# Patient Record
Sex: Male | Born: 1980
Health system: Southern US, Community
[De-identification: ages and names within clinical notes are randomized; demographics above are authoritative.]

## PROBLEM LIST (undated history)

## (undated) DIAGNOSIS — L659 Nonscarring hair loss, unspecified: Secondary | ICD-10-CM

## (undated) DIAGNOSIS — J302 Other seasonal allergic rhinitis: Secondary | ICD-10-CM

## (undated) HISTORY — DX: Nonscarring hair loss, unspecified: L65.9

## (undated) HISTORY — PX: PILONIDAL CYST EXCISION: SHX744

## (undated) HISTORY — DX: Other seasonal allergic rhinitis: J30.2

---

## 2016-08-03 ENCOUNTER — Ambulatory Visit (HOSPITAL_COMMUNITY)
Admission: RE | Admit: 2016-08-03 | Discharge: 2016-08-03 | Disposition: A | Discharge status: Home / Self Care | Payer: Self-pay | Source: Ambulatory Visit | Attending: Occupational Medicine | Admitting: Occupational Medicine

## 2016-08-03 ENCOUNTER — Other Ambulatory Visit: Payer: Self-pay | Admitting: Occupational Medicine

## 2016-08-03 DIAGNOSIS — R7611 Nonspecific reaction to tuberculin skin test without active tuberculosis: Secondary | ICD-10-CM

## 2017-03-30 LAB — CBC AND DIFFERENTIAL
HEMATOCRIT: 46 (ref 41–53)
Hemoglobin: 15.3 (ref 13.5–17.5)
Platelets: 219 (ref 150–399)
WBC: 8.6

## 2017-03-30 LAB — HEPATIC FUNCTION PANEL
ALT: 27 (ref 10–40)
AST: 32 (ref 14–40)
Alkaline Phosphatase: 48 (ref 25–125)
BILIRUBIN, TOTAL: 0.9

## 2017-03-30 LAB — BASIC METABOLIC PANEL
BUN: 12 (ref 4–21)
Creatinine: 0.7 (ref 0.6–1.3)
Glucose: 95
Potassium: 4.2 (ref 3.4–5.3)
SODIUM: 138 (ref 137–147)

## 2017-03-30 LAB — PSA: PSA: 0.29

## 2017-03-30 LAB — TSH: TSH: 1.08 (ref 0.41–5.90)

## 2017-03-30 LAB — LIPID PANEL
Cholesterol: 165 (ref 0–200)
HDL: 46 (ref 35–70)
LDL CALC: 105
TRIGLYCERIDES: 72 (ref 40–160)

## 2017-05-22 ENCOUNTER — Encounter: Payer: Self-pay | Admitting: Family Medicine

## 2017-05-22 ENCOUNTER — Ambulatory Visit: Payer: 59 | Admitting: Family Medicine

## 2017-05-22 VITALS — BP 110/82 | HR 56 | Temp 98.5°F | Ht 66.75 in | Wt 147.6 lb

## 2017-05-22 DIAGNOSIS — L659 Nonscarring hair loss, unspecified: Secondary | ICD-10-CM | POA: Insufficient documentation

## 2017-05-22 DIAGNOSIS — Z0000 Encounter for general adult medical examination without abnormal findings: Secondary | ICD-10-CM

## 2017-05-22 DIAGNOSIS — J302 Other seasonal allergic rhinitis: Secondary | ICD-10-CM | POA: Insufficient documentation

## 2017-05-22 DIAGNOSIS — R21 Rash and other nonspecific skin eruption: Secondary | ICD-10-CM | POA: Insufficient documentation

## 2017-05-22 MED ORDER — TRIAMCINOLONE ACETONIDE 0.1 % EX CREA
1.0000 | TOPICAL_CREAM | Freq: Two times a day (BID) | CUTANEOUS | 0 refills | Status: DC
Start: 2017-05-22 — End: 2019-09-20

## 2017-05-22 NOTE — Assessment & Plan Note (Signed)
S:Dry skin in winter. Silvery rash on dorsal aspect right wrist last winter.  At least 1 month rash ulnar side of rash- erythematous papules and itches at times. tried otc 1% hydrocortisone  Seems to be better in the sun.  A/P: we discussed possibility of psoriasis. Use triamcinolone 7-10 days. Discussed testing sun exposure more. May be worth seeing him in winter to see what it looks like. Discussed mainly topical treatments. No joint pain to suggest psoriatic arthritis.

## 2017-05-22 NOTE — Patient Instructions (Addendum)
Tdap next year. Or you can always stop by for a nurse visit- would need to schedule that. If you were to get a significant cut/scrape please let us know and we can do sooner.   Try triamcinolone for 7-10 days twice a day. Can cover with eucerin if desired. Can reuse in future but take 7-10 day break. With silvery appearance you have noticed and improvement with son- possible psoriasis.   No changes today

## 2017-05-22 NOTE — Progress Notes (Signed)
Phone: [phone]  Subjective:  Patient presents today to establish care.  Denies recent PCP. Chief complaint-noted.   See problem oriented charting  The following were reviewed and entered/updated in epic: Past Medical History:  Diagnosis Date  . Hair loss    finasteride 3-4x a week. still losing some hair  . Seasonal allergies    allegra   Patient Active Problem List   Diagnosis Date Noted  . Rash 05/22/2017    Priority: Low  . Hair loss 05/22/2017    Priority: Low  . Seasonal allergies     Priority: Low   Past Surgical History:  Procedure Laterality Date  . PILONIDAL CYST EXCISION      Family History  Problem Relation Age of Onset  . Hypertension Mother   . Heart attack Father        age 16, smoker  . Diabetes Father        just metformin  . Diabetes Sister        obese- metformin. right at 6.5  . Stroke Maternal Grandmother   . Atrial fibrillation Maternal Grandmother   . Dementia Maternal Grandfather   . Dementia Paternal Grandmother   . Dementia Paternal Grandfather   . Colon cancer Cousin        fathers cousin at 54. first cousin    Medications- reviewed and updated Current Outpatient Medications  Medication Sig Dispense Refill  . b complex vitamins capsule Take 1 capsule by mouth daily.    . fexofenadine-pseudoephedrine (ALLEGRA-D) 60-120 MG 12 hr tablet Take 1 tablet by mouth 2 (two) times daily.    . finasteride (PROPECIA) 1 MG tablet Take 1 mg by mouth daily.    . vitamin C (ASCORBIC ACID) 500 MG tablet Take 500 mg by mouth daily.    Marland Kitchen triamcinolone cream (KENALOG) 0.1 % Apply 1 application topically 2 (two) times daily. For 7-10 days maximum 80 g 0   No current facility-administered medications for this visit.     Allergies-reviewed and updated No Known Allergies  Social History   Social History Narrative  . Not on file    ROS--Full ROS was completed Review of Systems  Constitutional: Negative for chills and fever.  HENT: Negative  for hearing loss and tinnitus.   Eyes: Negative for blurred vision and double vision.  Respiratory: Negative for cough and hemoptysis.   Cardiovascular: Negative for chest pain and palpitations.  Gastrointestinal: Negative for heartburn and nausea.  Genitourinary: Negative for dysuria and urgency.  Musculoskeletal: Negative for myalgias and neck pain (none in over a year).  Skin: Positive for itching and rash.  Neurological: Negative for dizziness and headaches.  Endo/Heme/Allergies: Negative for polydipsia. Does not bruise/bleed easily.  Psychiatric/Behavioral: Negative for hallucinations and substance abuse.   Objective: BP 110/82 (BP Location: Left Arm, Patient Position: Sitting, Cuff Size: Normal)   Pulse (!) 56   Temp 98.5 F (36.9 C) (Oral)   Ht 5' 6.75" (1.695 m)   Wt 147 lb 9.6 oz (67 kg)   SpO2 97%   BMI 23.29 kg/m  Gen: NAD, resting comfortably HEENT: Mucous membranes are moist. Oropharynx normal. TM normal. Eyes: sclera and lids normal, PERRLA Neck: no thyromegaly, no cervical lymphadenopathy CV: slightly bradycardic but regular no murmurs rubs or gallops Lungs: CTAB no crackles, wheeze, rhonchi Abdomen: soft/nontender/nondistended/normal bowel sounds. No rebound or guarding.  Ext: no edema Skin: warm, dry Neuro: 5/5 strength in upper and lower extremities, normal gait, normal reflexes Assessment/Plan:  37 y.o. male presenting for  annual physical.  Health Maintenance counseling: 1. Anticipatory guidance: Patient counseled regarding regular dental exams -q6 months in [place] as well as locally, eye exams lasik done early 20s- occasional dry eyes, wearing seatbelts.  2. Risk factor reduction:  Advised patient of need for regular exercise and diet rich and fruits and vegetables to reduce risk of heart attack and stroke. Exercise- at least 3 days a week- used to be more into running. Diet-eats pretty clean.  Wt Readings from Last 3 Encounters:  05/22/17 147 lb 9.6 oz (67  kg)  3. Immunizations/screenings/ancillary studies- will do Tdap sometime in next year. Screened for HIV after needlestick. Will come in this year if he gets cut/scrape- otherwise likely do at CPE next year Immunization History  Administered Date(s) Administered  . Tdap 06/03/2008   4. Prostate cancer screening- screened through [facility] . No rectal planned Lab Results  Component Value Date   PSA 0.29 03/30/2017   5. Colon cancer screening - no family history, start at age 36 6. Skin cancer screening/prevention- advised regular sunscreen use. Denies worrisome, changing, or new skin lesions.  7. Testicular cancer screening- advised monthly self exams  8. 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 Since leaving fellowship- No neck pain in last year.   LDL 105 on labs. workign out 3x a week. LDL was better when he was running. LDL down to 78 with running- he is considering restarting  Rash S:Dry skin in winter. Silvery rash on dorsal aspect right wrist last winter.  At least 1 month rash ulnar side of rash- erythematous papules and itches at times. tried otc 1% hydrocortisone  Seems to be better in the sun.  A/P: we discussed possibility of psoriasis. Use triamcinolone 7-10 days. Discussed testing sun exposure more. May be worth seeing him in winter to see what it looks like. Discussed mainly topical treatments. No joint pain to suggest psoriatic arthritis.    1-2 year CPE  Meds ordered this encounter  Medications  . triamcinolone cream (KENALOG) 0.1 %    Sig: Apply 1 application topically 2 (two) times daily. For 7-10 days maximum    Dispense:  80 g    Refill:  0    Return precautions advised.   Tana Conch, MD

## 2017-06-05 DIAGNOSIS — H52203 Unspecified astigmatism, bilateral: Secondary | ICD-10-CM | POA: Diagnosis not present

## 2018-02-14 DIAGNOSIS — Z3141 Encounter for fertility testing: Secondary | ICD-10-CM | POA: Diagnosis not present

## 2018-06-16 IMAGING — DX DG CHEST 2V
2 series · 2 of 2 positions shown · non-contrast
Comparison: None.

CLINICAL DATA: Positive TB test.

EXAM:
CHEST  2 VIEW

[chest pa]
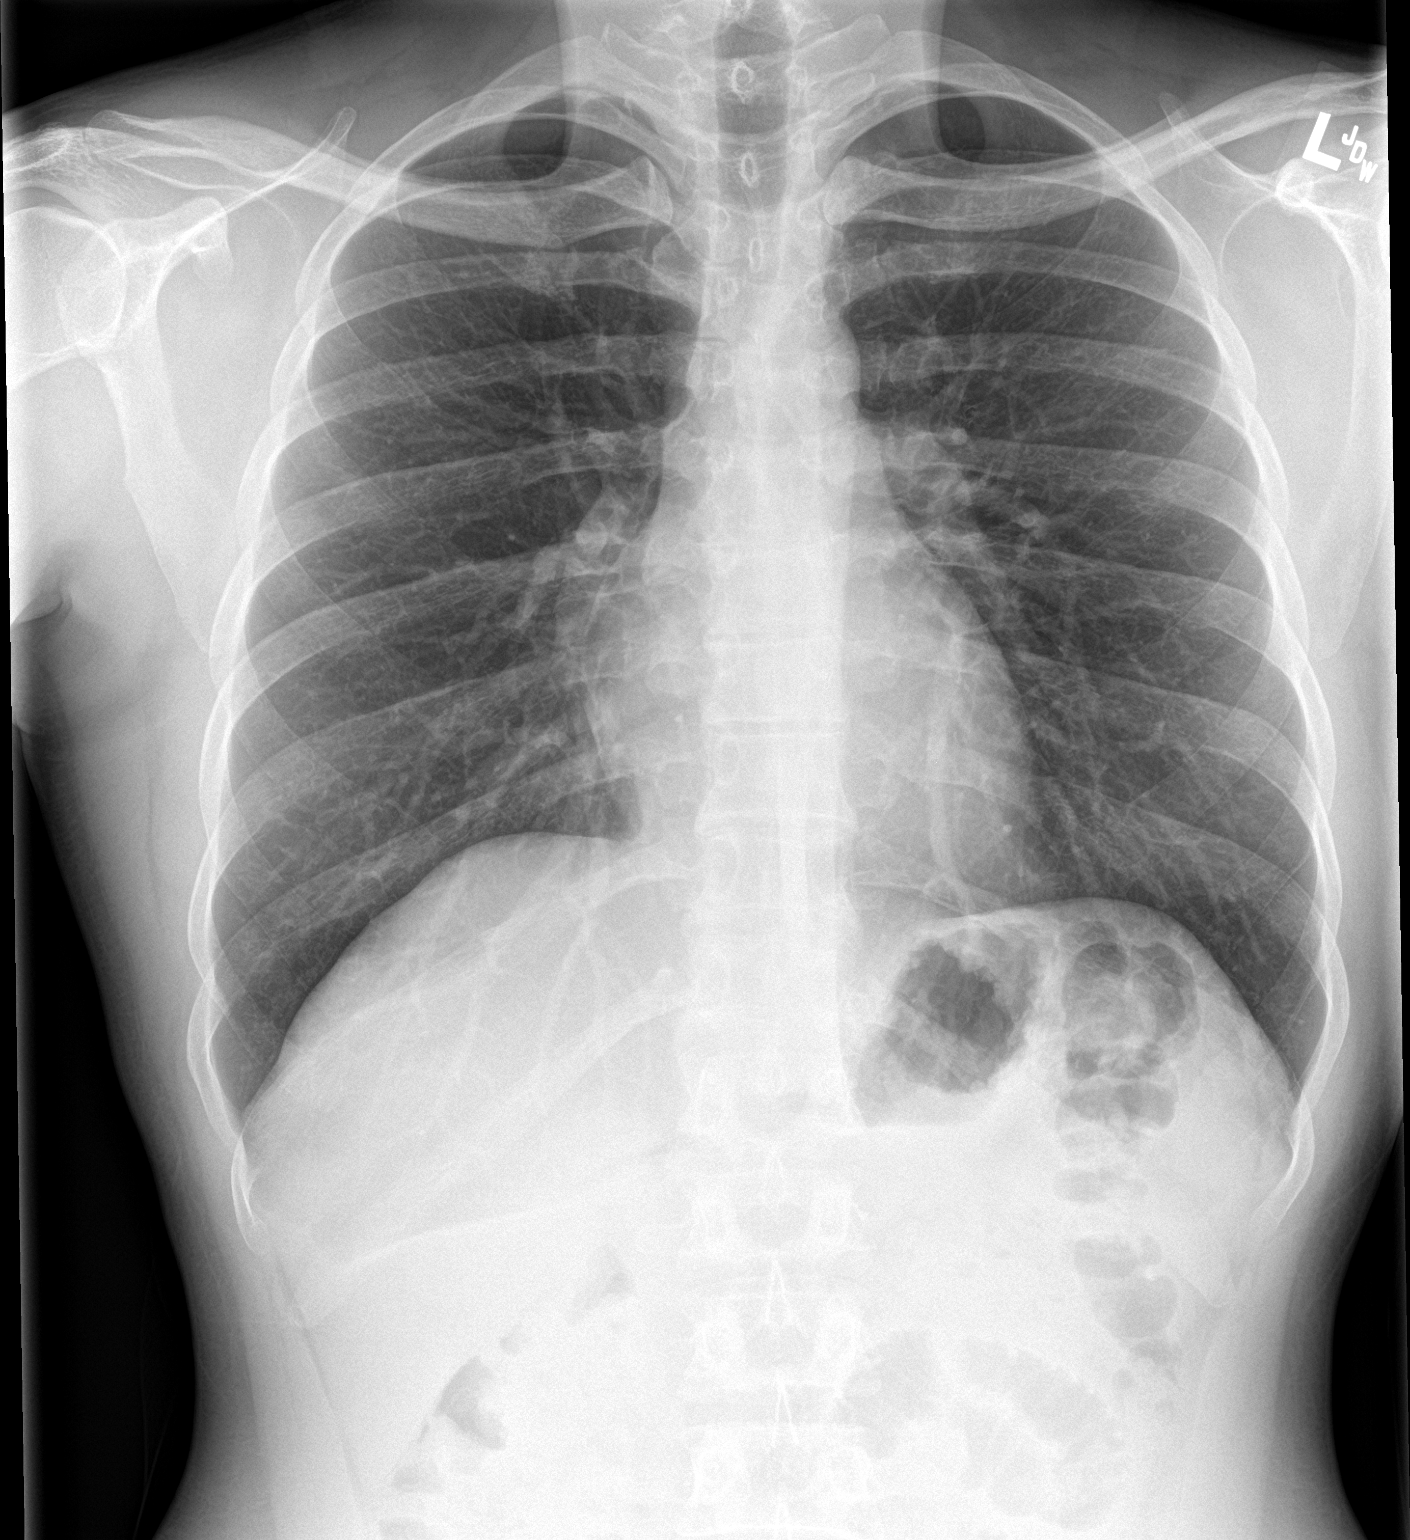

[chest lat]
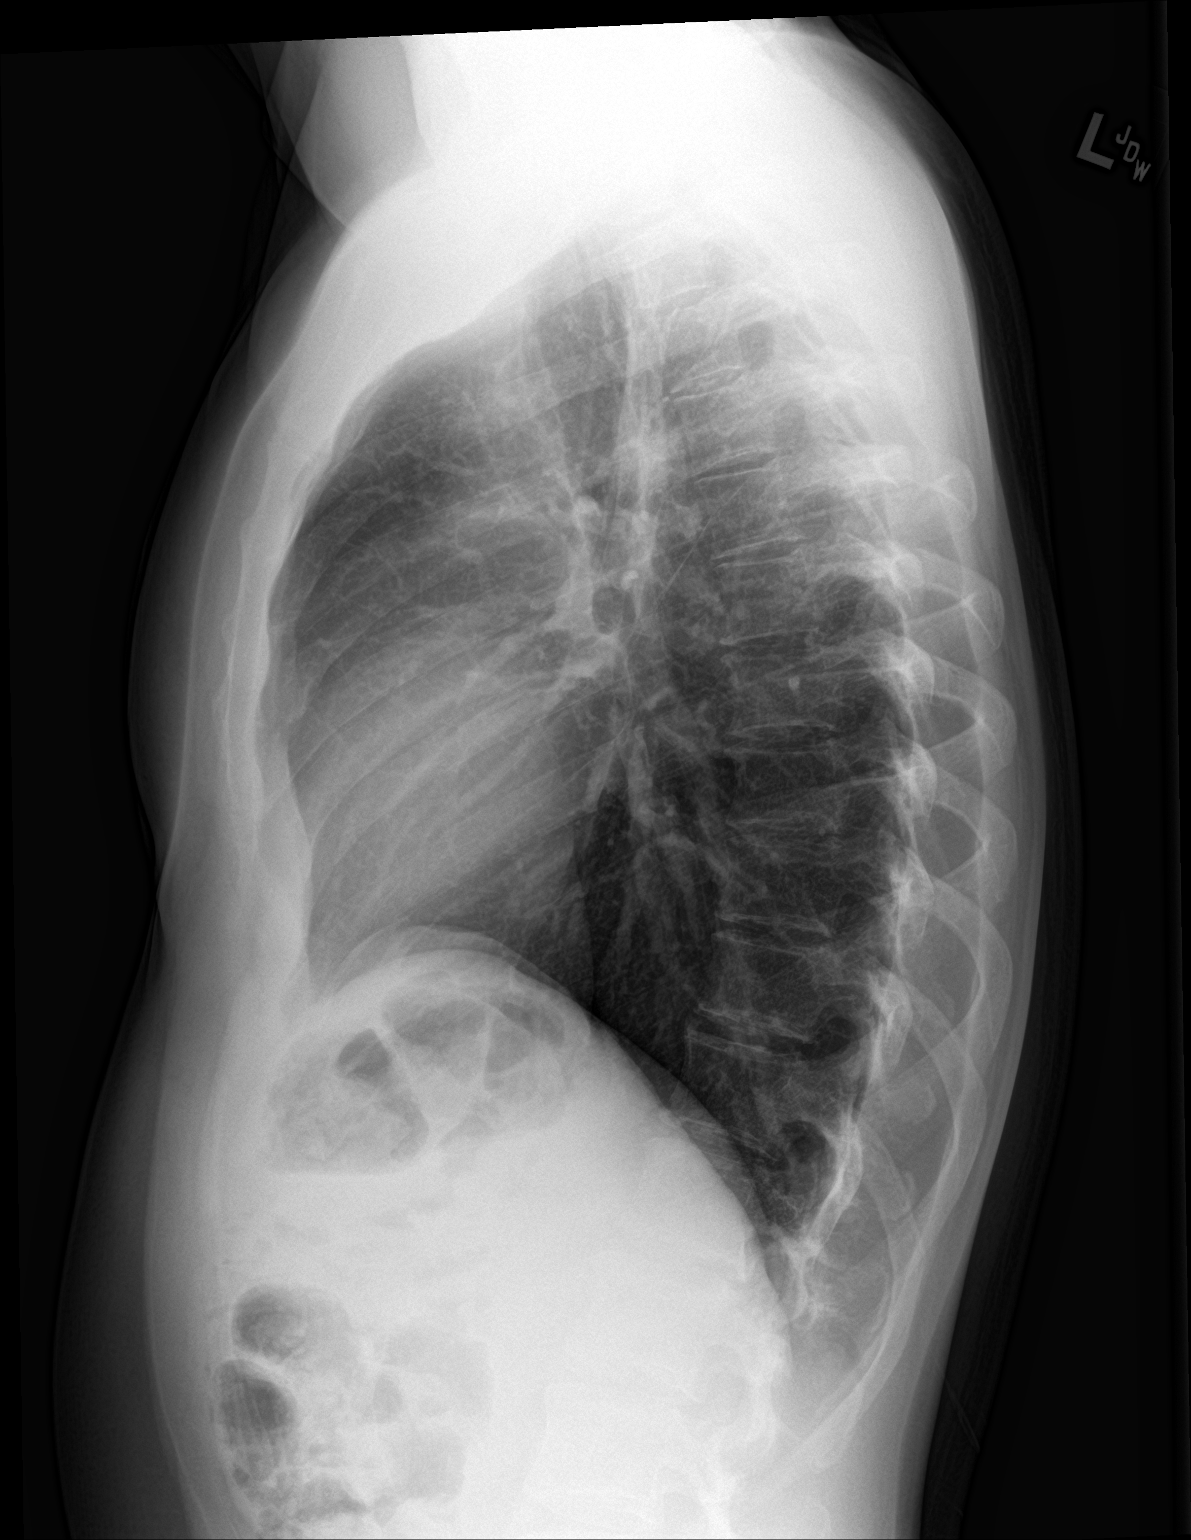

[2 of 2 positions shown; findings below may reference images not displayed]

FINDINGS: The lungs are clear. Heart size is normal. No pneumothorax or
pleural fluid. No bony abnormality.
IMPRESSION: Normal chest.

## 2018-06-20 DIAGNOSIS — Z113 Encounter for screening for infections with a predominantly sexual mode of transmission: Secondary | ICD-10-CM | POA: Diagnosis not present

## 2018-07-21 DIAGNOSIS — Z3189 Encounter for other procreative management: Secondary | ICD-10-CM | POA: Diagnosis not present

## 2019-05-29 NOTE — Progress Notes (Deleted)
Phone: [phone]    Subjective:  Patient presents today for their annual physical. Chief complaint-noted.   See problem oriented charting- ROS- full  review of systems was completed and negative  except for: ***  The following were reviewed and entered/updated in epic: Past Medical History:  Diagnosis Date  . Hair loss    finasteride 3-4x a week. still losing some hair  . Seasonal allergies    allegra   Patient Active Problem List   Diagnosis Date Noted  . Rash 05/22/2017  . Hair loss 05/22/2017  . Seasonal allergies    Past Surgical History:  Procedure Laterality Date  . PILONIDAL CYST EXCISION      Family History  Problem Relation Age of Onset  . Hypertension Mother   . Heart attack Father        age 37, smoker  . Diabetes Father        just metformin  . Diabetes Sister        obese- metformin. right at 6.5  . Stroke Maternal Grandmother   . Atrial fibrillation Maternal Grandmother   . Dementia Maternal Grandfather   . Dementia Paternal Grandmother   . Dementia Paternal Grandfather   . Colon cancer Cousin        fathers cousin at 106. first cousin    Medications- reviewed and updated Current Outpatient Medications  Medication Sig Dispense Refill  . b complex vitamins capsule Take 1 capsule by mouth daily.    . fexofenadine-pseudoephedrine (ALLEGRA-D) 60-120 MG 12 hr tablet Take 1 tablet by mouth 2 (two) times daily.    . finasteride (PROPECIA) 1 MG tablet Take 1 mg by mouth daily.    Marland Kitchen triamcinolone cream (KENALOG) 0.1 % Apply 1 application topically 2 (two) times daily. For 7-10 days maximum 80 g 0  . vitamin C (ASCORBIC ACID) 500 MG tablet Take 500 mg by mouth daily.     No current facility-administered medications for this visit.    Allergies-reviewed and updated No Known Allergies  Social History   Social History Narrative  . Not on file      Objective:  There were no vitals taken for this visit. Gen: NAD, resting comfortably HEENT:  Mucous membranes are moist. Oropharynx normal Neck: no thyromegaly CV: RRR no murmurs rubs or gallops Lungs: CTAB no crackles, wheeze, rhonchi Abdomen: soft/nontender/nondistended/normal bowel sounds. No rebound or guarding.  Ext: no edema Skin: warm, dry Neuro: grossly normal, moves all extremities, PERRLA ***    Assessment and Plan:  39 y.o. male presenting for annual physical.  Health Maintenance counseling: 1. Anticipatory guidance: Patient counseled regarding regular dental exams ***q6 months, eye exams ***,  avoiding smoking and second hand smoke*** , limiting alcohol to 2 beverages per day***.   2. Risk factor reduction:  Advised patient of need for regular exercise and diet rich and fruits and vegetables to reduce risk of heart attack and stroke. Exercise- ***. Diet-***.  Wt Readings from Last 3 Encounters:  05/22/17 147 lb 9.6 oz (67 kg)   3. Immunizations/screenings/ancillary studies Immunization History  Administered Date(s) Administered  . Tdap 06/03/2008   Health Maintenance Due  Topic Date Due  . TETANUS/TDAP  06/04/2018    Family History  Problem Relation Age of Onset  . Hypertension Mother   . Heart attack Father        age 2, smoker  . Diabetes Father        just metformin  . Diabetes Sister  obese- metformin. right at 6.5  . Stroke Maternal Grandmother   . Atrial fibrillation Maternal Grandmother   . Dementia Maternal Grandfather   . Dementia Paternal Grandmother   . Dementia Paternal Grandfather   . Colon cancer Cousin        fathers cousin at 40. first cousin   4. Prostate cancer screening- ***  Lab Results  Component Value Date   PSA 0.29 03/30/2017   5. Colon cancer screening - *** 6. Skin cancer screening/prevention- ***advised regular sunscreen use. Denies worrisome, changing, or new skin lesions.  7. Testicular cancer screening- advised monthly self exams *** 8. STD screening- patient opts *** 9. *** smoker-   Status of chronic or  acute concerns   No specialty comments available. *** No diagnosis found.  Recommended follow up: ***No follow-ups on file. Future Appointments  Date Time Provider Department Center  06/06/2019  4:00 PM Shelva Majestic, MD [facility]    No chief complaint on file.  Lab/Order associations:*** fasting No diagnosis found.  No orders of the defined types were placed in this encounter.   Return precautions advised.   Lieutenant Diego, CMA

## 2019-06-06 ENCOUNTER — Encounter: Payer: 59 | Admitting: Family Medicine

## 2019-09-19 NOTE — Patient Instructions (Addendum)
Thanks for doing labs If you have mychart- we will send your results within 3 business days of [place] receiving them.  If you do not have mychart- we will call you about results within 5 business days of [place] receiving them.  *please note we are currently using [facility] which has a longer processing time than [place] typically so labs may not come back as quickly as in the past *please also note that you will see labs on mychart as soon as they post. I will later go in and write notes on them- will say "notes from Dr. Durene Cal"  Health Maintenance Due  Topic Date Due  . TETANUS/TDAP --if you can find date of this from 2018 please send to me and will update 06/04/2018  . INFLUENZA VACCINE Declined in office flu shot. Patient stated that he will get thru his job. Never done   Recommended follow up:  1-2 years for physical

## 2019-09-19 NOTE — Progress Notes (Signed)
Phone: [phone]    Subjective:  Patient presents today for their annual physical. Chief complaint-noted.   See problem oriented charting- ROS- full  review of systems was completed and negative  except for: slight lymphadenopathy  The following were reviewed and entered/updated in epic: Past Medical History:  Diagnosis Date  . Hair loss    finasteride 3-4x a week. still losing some hair  . Seasonal allergies    allegra   Patient Active Problem List   Diagnosis Date Noted  . Rash 05/22/2017    Priority: Low  . Hair loss 05/22/2017    Priority: Low  . Seasonal allergies     Priority: Low   Past Surgical History:  Procedure Laterality Date  . PILONIDAL CYST EXCISION      Family History  Problem Relation Age of Onset  . Hypertension Mother   . Heart attack Father        age 39, smoker  . Diabetes Father        just metformin  . Diabetes Sister        obese- metformin. right at 6.5  . Stroke Maternal Grandmother   . Atrial fibrillation Maternal Grandmother   . Dementia Maternal Grandfather   . Dementia Paternal Grandmother   . Dementia Paternal Grandfather   . Colon cancer Cousin        fathers cousin at 32. first cousin    Medications- reviewed and updated Current Outpatient Medications  Medication Sig Dispense Refill  . b complex vitamins capsule Take 1 capsule by mouth daily.    . fexofenadine-pseudoephedrine (ALLEGRA-D) 60-120 MG 12 hr tablet Take 1 tablet by mouth 2 (two) times daily.    . vitamin C (ASCORBIC ACID) 500 MG tablet Take 500 mg by mouth daily.     No current facility-administered medications for this visit.    Allergies-reviewed and updated No Known Allergies  Social History   Social History Narrative  . Not on file      Objective:  BP 120/64   Pulse (!) 57   Temp 98.2 F (36.8 C) (Temporal)   Resp 18   Ht 5\' 7"  (1.702 m)   Wt 153 lb 12.8 oz (69.8 kg)   SpO2 99%   BMI 24.09 kg/m  Gen: NAD, resting comfortably HEENT:  Mucous membranes are moist. Oropharynx normal <1 cm lymph node in anterior chain noted.  Neck: no thyromegaly CV: RRR no murmurs rubs or gallops Lungs: CTAB no crackles, wheeze, rhonchi Abdomen: soft/nontender/nondistended/normal bowel sounds. No rebound or guarding.  Ext: no edema Skin: warm, dry Neuro: grossly normal, moves all extremities, PERRLA     Assessment and Plan:  39 y.o. male presenting for annual physical.  Health Maintenance counseling: 1. Anticipatory guidance: Patient counseled regarding regular dental exams q6 months, eye exams- lasik surgery- considering follow up ,  avoiding smoking and second hand smoke, limiting alcohol to 2 beverages per day.   2. Risk factor reduction:  Advised patient of need for regular exercise and diet rich and fruits and vegetables to reduce risk of heart attack and stroke. Exercise- mile in every other day. Diet- reasonably healthy.  3. Immunizations/screenings/ancillary studies- will get flu shot through work. Tdap in 2018- he will try to find date Immunization History  Administered Date(s) Administered  . PFIZER SARS-COV-2 Vaccination 12/20/2018, 01/18/2019  . Tdap 06/03/2008    4. Prostate cancer screening- previously screened through Dr. 08/03/2008 labs. He is on finasteride previously- will no longer screen psa as no longer  on finasteride  Lab Results  Component Value Date   PSA 0.29 03/30/2017   5. Colon cancer screening -  no family history, start at age 4  6. Skin cancer screening/prevention-low risk due to melanin content.advised regular sunscreen use. Denies worrisome, changing, or new skin lesions.  7. Testicular cancer screening- advised monthly self exams  8. STD screening- patient opts out as monogamous  9. Never smoker-   Status of chronic or acute concerns   #hyperlipidemia-Father with MI at age 40 S: Medication:none  Lab Results  Component Value Date   CHOL 165 03/30/2017   HDL 46 03/30/2017   LDLCALC 105 03/30/2017    TRIG 72 03/30/2017   A/P: we will get baseline coronary calcium scoring and use this to consider statin therapy   #Rash-dry skin in the winter-seems silvery. We discussed possible psoriasis last visit. Triamcinolone trial was helpful- had last winter but not as bad. No recent issues.   # lymphaddenopathy- left anterior chain for 2 days- does not feel lymph node enlarged on his exam- I feel a very slight enlargement. No cough, fever, sore throat. Also tested for covid 19 yesterday- result pending (possible work exposure). 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 Prior flexeril use. No issues in the last 3 years.   # family history diabetes- update a1c with labs today  Recommended follow up:  1-2 years for physical  Lab/Order associations:Non fasting   ICD-10-CM   1. Preventative health care  Z00.00 COMPLETE METABOLIC PANEL WITH GFR    Lipid panel    CBC    Hepatitis C antibody  2. Encounter for hepatitis C screening test for low risk patient  Z11.59 Hepatitis C antibody  3. Hyperlipidemia, unspecified hyperlipidemia type  E78.5 CT CARDIAC SCORING  4. Family history of early CAD  Z39.49 CT CARDIAC SCORING  5. Family history of diabetes mellitus  Z83.3 Hemoglobin A1c   Return precautions advised.  Tana Conch, MD

## 2019-09-20 ENCOUNTER — Ambulatory Visit (INDEPENDENT_AMBULATORY_CARE_PROVIDER_SITE_OTHER): Payer: 59 | Admitting: Family Medicine

## 2019-09-20 ENCOUNTER — Encounter: Payer: Self-pay | Admitting: Family Medicine

## 2019-09-20 ENCOUNTER — Other Ambulatory Visit: Payer: Self-pay

## 2019-09-20 VITALS — BP 120/64 | HR 57 | Temp 98.2°F | Resp 18 | Ht 67.0 in | Wt 153.8 lb

## 2019-09-20 DIAGNOSIS — E785 Hyperlipidemia, unspecified: Secondary | ICD-10-CM

## 2019-09-20 DIAGNOSIS — Z1159 Encounter for screening for other viral diseases: Secondary | ICD-10-CM | POA: Diagnosis not present

## 2019-09-20 DIAGNOSIS — Z8249 Family history of ischemic heart disease and other diseases of the circulatory system: Secondary | ICD-10-CM | POA: Diagnosis not present

## 2019-09-20 DIAGNOSIS — Z0000 Encounter for general adult medical examination without abnormal findings: Secondary | ICD-10-CM

## 2019-09-20 DIAGNOSIS — Z833 Family history of diabetes mellitus: Secondary | ICD-10-CM

## 2019-09-21 LAB — HEMOGLOBIN A1C
Hgb A1c MFr Bld: 5.5 % of total Hgb (ref <5.7)
Mean Plasma Glucose: 111 (calc)
eAG (mmol/L): 6.2 (calc)

## 2019-09-23 ENCOUNTER — Encounter: Payer: Self-pay | Admitting: Family Medicine

## 2019-09-23 LAB — CBC
HCT: 45.2 % (ref 38.5–50.0)
Hemoglobin: 15.4 g/dL (ref 13.2–17.1)
MCH: 29.8 pg (ref 27.0–33.0)
MCHC: 34.1 g/dL (ref 32.0–36.0)
MCV: 87.6 fL (ref 80.0–100.0)
MPV: 10.8 fL (ref 7.5–12.5)
Platelets: 223 10*3/uL (ref 140–400)
RBC: 5.16 10*6/uL (ref 4.20–5.80)
RDW: 11.6 % (ref 11.0–15.0)
WBC: 8.1 10*3/uL (ref 3.8–10.8)

## 2019-09-23 LAB — COMPLETE METABOLIC PANEL WITH GFR
AG Ratio: 1.7 (calc) (ref 1.0–2.5)
ALT: 24 U/L (ref 9–46)
AST: 26 U/L (ref 10–40)
Albumin: 4.8 g/dL (ref 3.6–5.1)
Alkaline phosphatase (APISO): 51 U/L (ref 36–130)
BUN: 13 mg/dL (ref 7–25)
CO2: 27 mmol/L (ref 20–32)
Calcium: 10 mg/dL (ref 8.6–10.3)
Chloride: 101 mmol/L (ref 98–110)
Creat: 0.67 mg/dL (ref 0.60–1.35)
GFR, Est African American: 143 mL/min/{1.73_m2} (ref >=60)
GFR, Est Non African American: 124 mL/min/{1.73_m2} (ref >=60)
Globulin: 2.9 g/dL (calc) (ref 1.9–3.7)
Glucose, Bld: 78 mg/dL (ref 65–99)
Potassium: 4.1 mmol/L (ref 3.5–5.3)
Sodium: 137 mmol/L (ref 135–146)
Total Bilirubin: 0.8 mg/dL (ref 0.2–1.2)
Total Protein: 7.7 g/dL (ref 6.1–8.1)

## 2019-09-23 LAB — LIPID PANEL
Cholesterol: 200 mg/dL — ABNORMAL HIGH (ref <200)
HDL: 51 mg/dL (ref >=40)
LDL Cholesterol (Calc): 124 mg/dL (calc) — ABNORMAL HIGH
Non-HDL Cholesterol (Calc): 149 mg/dL (calc) — ABNORMAL HIGH (ref <130)
Total CHOL/HDL Ratio: 3.9 (calc) (ref <5.0)
Triglycerides: 134 mg/dL (ref <150)

## 2019-09-23 LAB — HEPATITIS C ANTIBODY
Hepatitis C Ab: NONREACTIVE
SIGNAL TO CUT-OFF: 0.01 (ref <1.00)

## 2019-10-17 ENCOUNTER — Inpatient Hospital Stay: Admission: RE | Admit: 2019-10-17 | Payer: 59 | Source: Ambulatory Visit

## 2019-10-24 ENCOUNTER — Other Ambulatory Visit: Payer: Self-pay

## 2019-10-24 ENCOUNTER — Ambulatory Visit (INDEPENDENT_AMBULATORY_CARE_PROVIDER_SITE_OTHER)
Admission: RE | Admit: 2019-10-24 | Discharge: 2019-10-24 | Disposition: A | Discharge status: Home / Self Care | Payer: Self-pay | Source: Ambulatory Visit | Attending: Family Medicine | Admitting: Family Medicine

## 2019-10-24 DIAGNOSIS — E785 Hyperlipidemia, unspecified: Secondary | ICD-10-CM

## 2019-10-24 DIAGNOSIS — Z8249 Family history of ischemic heart disease and other diseases of the circulatory system: Secondary | ICD-10-CM

## 2020-04-14 ENCOUNTER — Encounter: Payer: Self-pay | Admitting: Family Medicine

## 2020-04-14 DIAGNOSIS — Z789 Other specified health status: Secondary | ICD-10-CM

## 2020-04-14 DIAGNOSIS — Z113 Encounter for screening for infections with a predominantly sexual mode of transmission: Secondary | ICD-10-CM

## 2020-04-14 DIAGNOSIS — Z118 Encounter for screening for other infectious and parasitic diseases: Secondary | ICD-10-CM

## 2020-04-14 DIAGNOSIS — Z111 Encounter for screening for respiratory tuberculosis: Secondary | ICD-10-CM

## 2020-04-15 ENCOUNTER — Other Ambulatory Visit (INDEPENDENT_AMBULATORY_CARE_PROVIDER_SITE_OTHER): Payer: 59

## 2020-04-15 ENCOUNTER — Other Ambulatory Visit: Payer: Self-pay

## 2020-04-15 ENCOUNTER — Other Ambulatory Visit (HOSPITAL_COMMUNITY)
Admission: RE | Admit: 2020-04-15 | Discharge: 2020-04-15 | Disposition: A | Discharge status: Home / Self Care | Payer: 59 | Source: Ambulatory Visit | Attending: Family Medicine | Admitting: Family Medicine

## 2020-04-15 DIAGNOSIS — Z113 Encounter for screening for infections with a predominantly sexual mode of transmission: Secondary | ICD-10-CM | POA: Diagnosis not present

## 2020-04-15 DIAGNOSIS — Z111 Encounter for screening for respiratory tuberculosis: Secondary | ICD-10-CM | POA: Diagnosis not present

## 2020-04-15 DIAGNOSIS — Z118 Encounter for screening for other infectious and parasitic diseases: Secondary | ICD-10-CM | POA: Diagnosis not present

## 2020-04-15 DIAGNOSIS — Z789 Other specified health status: Secondary | ICD-10-CM | POA: Diagnosis not present

## 2020-04-17 LAB — URINE CYTOLOGY ANCILLARY ONLY
Chlamydia: NEGATIVE
Comment: NEGATIVE
Comment: NORMAL
Neisseria Gonorrhea: NEGATIVE

## 2020-04-18 LAB — VARICELLA ZOSTER ANTIBODY, IGG: Varicella IgG: 2445 index

## 2020-04-18 LAB — MEASLES/MUMPS/RUBELLA IMMUNITY
Mumps IgG: 214 AU/mL
Rubella: 12.6 Index
Rubeola IgG: >300 AU/mL

## 2020-04-18 LAB — QUANTIFERON-TB GOLD PLUS
Mitogen-NIL: >10 IU/mL
NIL: 0.03 IU/mL
QuantiFERON-TB Gold Plus: NEGATIVE
TB1-NIL: 0.05 IU/mL
TB2-NIL: 0.18 IU/mL

## 2020-04-18 LAB — RPR: RPR Ser Ql: NONREACTIVE

## 2021-03-08 DIAGNOSIS — H00015 Hordeolum externum left lower eyelid: Secondary | ICD-10-CM | POA: Diagnosis not present

## 2021-04-25 LAB — LIPID PANEL
Cholesterol: 184 (ref 0–200)
HDL: 50 (ref 35–70)
LDL Cholesterol: 114
Triglycerides: 111 (ref 40–160)

## 2021-04-25 LAB — HEMOGLOBIN A1C: Hemoglobin A1C: 5.8

## 2021-04-25 LAB — HEPATIC FUNCTION PANEL: ALT: 31 U/L (ref 10–40)

## 2021-08-12 ENCOUNTER — Ambulatory Visit: Payer: 59 | Admitting: Family Medicine

## 2021-08-12 ENCOUNTER — Other Ambulatory Visit (HOSPITAL_COMMUNITY): Payer: Self-pay

## 2021-08-12 ENCOUNTER — Encounter: Payer: Self-pay | Admitting: Family Medicine

## 2021-08-12 VITALS — BP 118/70 | HR 52 | Temp 97.7°F | Ht 67.0 in | Wt 147.4 lb

## 2021-08-12 DIAGNOSIS — E785 Hyperlipidemia, unspecified: Secondary | ICD-10-CM | POA: Diagnosis not present

## 2021-08-12 DIAGNOSIS — R21 Rash and other nonspecific skin eruption: Secondary | ICD-10-CM

## 2021-08-12 DIAGNOSIS — Z8249 Family history of ischemic heart disease and other diseases of the circulatory system: Secondary | ICD-10-CM | POA: Diagnosis not present

## 2021-08-12 DIAGNOSIS — L309 Dermatitis, unspecified: Secondary | ICD-10-CM

## 2021-08-12 MED ORDER — SIMVASTATIN 10 MG PO TABS
10.0000 mg | ORAL_TABLET | Freq: Every day | ORAL | 3 refills | Status: DC
  Filled 2021-08-12 – 2021-08-30 (×2): qty 90, 90d supply, fill #0
  Filled 2022-07-01: qty 90, 90d supply, fill #1

## 2021-08-12 MED ORDER — TRIAMCINOLONE ACETONIDE 0.1 % EX OINT
1.0000 | TOPICAL_OINTMENT | Freq: Two times a day (BID) | CUTANEOUS | 1 refills | Status: AC
  Filled 2021-08-12: qty 80, 30d supply, fill #0

## 2021-08-12 NOTE — Assessment & Plan Note (Signed)
S: Ongoing issues throughout the summer-has had skin abrasions due to the dryness.  Has tried lotion as well as steroid creams without significant improvement -worse in winter but still not improving in the summertime- particularly between webs of finger -triamcinolone with only with temporary relief - has tried eucerin, cerave, etc.  -dries well between webs of fingers- does use sanitizer fair amount A/P: ongoing issues since at least 2019. Today  Thought to be potentially eczema-partially responsive to triamcinolone cream-we will advance ointment and try cloth gloves at night.  Can continue to apply emollient like Eucerin or Cerave when applies triamcinolone -With ongoing issues also recommended referral to Dr. Sharyn Lull or Dr. Bonnielee Haff today but we discussed could take some time to get into visit - in past described silvery patches on wrists that did resolve with triamcinolone -psoriasis in differential

## 2021-08-12 NOTE — Progress Notes (Signed)
Phone [phone] In person visit   Subjective:   John Ho is a 41 y.o. year old very pleasant male patient who presents for/with See problem oriented charting Chief Complaint  Patient presents with   Hyperlipidemia    Primary prevention would like to discuss family hx. (Cholesterol labs abstracted and a1c)    skin dryness    Pt states skin dryness has continued on through the summer and has had skin abrasions on the hands due to dryness. He has tried lotion and steroid ointment.   Past Medical History-  Patient Active Problem List   Diagnosis Date Noted   Hyperlipidemia, unspecified 08/12/2021    Priority: Medium    Eczema 08/12/2021    Priority: Low   Hair loss 05/22/2017    Priority: Low   Seasonal allergies     Priority: Low    Medications- reviewed and updated Current Outpatient Medications  Medication Sig Dispense Refill   simvastatin (ZOCOR) 10 MG tablet Take 1 tablet (10 mg total) by mouth at bedtime. 90 tablet 3   triamcinolone ointment (KENALOG) 0.1 % apply 1 application topically 2 (two) times daily. 7-10 days for dry areas on hands 80 g 1   No current facility-administered medications for this visit.     Objective:  BP 118/70   Pulse (!) 52   Temp 97.7 F (36.5 C)   Ht 5\' 7"  (1.702 m)   Wt 147 lb 6.4 oz (66.9 kg)   SpO2 98%   BMI 23.09 kg/m  Gen: NAD, resting comfortably    Assessment and Plan   #Social update- doing best to get sleep with 2 kids (1 is 4 months) , working spouse and full time job  #hyperlipidemia In patient with family history of MI in father around age 69 or 33 (former smoker) S: Medication:none  -Reassuring CT cardiac scoring of 0 on 10/24/2019 - feels eating reasonably healthy- trying to run as best he can in the mornings -already on vegetarian diet. Also cut down on nuts Lab Results  Component Value Date   CHOL 184 04/25/2021   HDL 50 04/25/2021   LDLCALC 114 04/25/2021   TRIG 111 04/25/2021   CHOLHDL 3.9  09/20/2019    Lab Results  Component Value Date   HGBA1C 5.8 04/25/2021   A/P: Patient would like to be very proactive about reducing cardiovascular risk particular with family history and ethnicity-would like to update lipid panel and if remains above 70 start simvastatin 10 mg-I think this is reasonable - If lipoprotein a is elevated he may hold off and look at therapeutics directed at this issue in the future  # Hand dryness/rash on hands S: Ongoing issues throughout the summer-has had skin abrasions due to the dryness.  Has tried lotion as well as steroid creams without significant improvement -worse in winter but still not improving in the summertime- particularly between webs of finger -triamcinolone with only with temporary relief - has tried eucerin, cerave, etc.  -dries well between webs of fingers- does use sanitizer fair amount A/P: Thought to be potentially eczema-partially responsive to triamcinolone cream-we will advance ointment and try cloth gloves at night.  Can continue to apply emollient like Eucerin or Cerave when applies triamcinolone -With ongoing issues also recommended referral to Dr. 03-21-1985 or Dr. Sharyn Lull today but we discussed could take some time to get into visit - in past described silvery patches on wrists that did resolve with triamcinolone -psoriasis in differential  Recommended follow up: at least annual  follow up reasonable  Lab/Order associations:   ICD-10-CM   1. Hyperlipidemia, unspecified hyperlipidemia type  E78.5 Lipoprotein A (LPA)    Lipid panel    2. Eczema, unspecified type  L30.9     3. Rash of both hands  R21 Ambulatory referral to Dermatology    4. Family history of early CAD  Z50.49 Lipoprotein A (LPA)    Lipid panel      Meds ordered this encounter  Medications   triamcinolone ointment (KENALOG) 0.1 %    Sig: apply 1 application topically 2 (two) times daily. 7-10 days for dry areas on hands    Dispense:  80 g     Refill:  1   simvastatin (ZOCOR) 10 MG tablet    Sig: Take 1 tablet (10 mg total) by mouth at bedtime.    Dispense:  90 tablet    Refill:  3    Return precautions advised.  Tana Conch, MD

## 2021-08-12 NOTE — Patient Instructions (Addendum)
Flu shot-let us know when you get your flu shot  Please stop by lab before you go If you have mychart- we will send your results within 3 business days of [place] receiving them.  If you do not have mychart- we will call you about results within 5 business days of [place] receiving them.  *please also note that you will see labs on mychart as soon as they post. I will later go in and write notes on them- will say "notes from Dr. Durene Cal"   Likely start simvastatin 10mg - I already sent this in but we can adjust

## 2021-08-13 LAB — LIPID PANEL
Cholesterol: 180 mg/dL (ref 0–200)
HDL: 48.1 mg/dL (ref >39.00)
LDL Cholesterol: 110 mg/dL — ABNORMAL HIGH (ref 0–99)
NonHDL: 131.65
Total CHOL/HDL Ratio: 4
Triglycerides: 108 mg/dL (ref 0.0–149.0)
VLDL: 21.6 mg/dL (ref 0.0–40.0)

## 2021-08-17 LAB — LIPOPROTEIN A (LPA): Lipoprotein (a): 28 nmol/L (ref <75)

## 2021-08-25 ENCOUNTER — Other Ambulatory Visit (HOSPITAL_COMMUNITY): Payer: Self-pay

## 2021-08-26 ENCOUNTER — Other Ambulatory Visit (HOSPITAL_COMMUNITY): Payer: Self-pay

## 2021-08-26 DIAGNOSIS — L309 Dermatitis, unspecified: Secondary | ICD-10-CM | POA: Diagnosis not present

## 2021-08-26 MED ORDER — BETAMETHASONE DIPROPIONATE 0.05 % EX CREA
1.0000 | TOPICAL_CREAM | Freq: Two times a day (BID) | CUTANEOUS | 0 refills | Status: DC
Start: 2021-08-26 — End: 2023-03-14
  Filled 2021-08-26: qty 60, 14d supply, fill #0

## 2021-08-27 ENCOUNTER — Other Ambulatory Visit (HOSPITAL_COMMUNITY): Payer: Self-pay

## 2021-08-30 ENCOUNTER — Other Ambulatory Visit (HOSPITAL_COMMUNITY): Payer: Self-pay

## 2021-09-27 ENCOUNTER — Encounter: Payer: Self-pay | Admitting: *Deleted

## 2021-10-06 ENCOUNTER — Other Ambulatory Visit (HOSPITAL_COMMUNITY): Payer: Self-pay

## 2021-10-06 DIAGNOSIS — L309 Dermatitis, unspecified: Secondary | ICD-10-CM | POA: Diagnosis not present

## 2021-10-06 MED ORDER — TACROLIMUS 0.1 % EX OINT
TOPICAL_OINTMENT | Freq: Two times a day (BID) | CUTANEOUS | 0 refills | Status: AC
  Filled 2021-10-06: qty 60, 42d supply, fill #0

## 2021-12-02 ENCOUNTER — Other Ambulatory Visit: Payer: Self-pay

## 2021-12-02 MED ORDER — COVID-19 MRNA 2023-2024 VACCINE (COMIRNATY) 0.3 ML INJECTION
0.3000 mL | Freq: Once | INTRAMUSCULAR | 0 refills | Status: AC
  Filled 2021-12-02: qty 0.3, 1d supply, fill #0

## 2021-12-16 ENCOUNTER — Encounter: Payer: Self-pay | Admitting: *Deleted

## 2022-01-19 ENCOUNTER — Other Ambulatory Visit: Payer: Self-pay | Admitting: Cardiology

## 2022-07-01 ENCOUNTER — Other Ambulatory Visit (HOSPITAL_COMMUNITY): Payer: Self-pay

## 2022-10-17 ENCOUNTER — Encounter: Payer: Self-pay | Admitting: Family Medicine

## 2023-02-06 ENCOUNTER — Other Ambulatory Visit: Payer: Self-pay | Admitting: Family Medicine

## 2023-02-06 ENCOUNTER — Other Ambulatory Visit (HOSPITAL_COMMUNITY): Payer: Self-pay

## 2023-02-06 MED ORDER — SIMVASTATIN 10 MG PO TABS
10.0000 mg | ORAL_TABLET | Freq: Every day | ORAL | 3 refills | Status: AC
  Filled 2023-02-06: qty 90, 90d supply, fill #0
  Filled 2023-07-18: qty 90, 90d supply, fill #1
  Filled 2023-11-28: qty 90, 90d supply, fill #2
  Filled 2024-01-15: qty 90, 90d supply, fill #0

## 2023-03-14 ENCOUNTER — Encounter: Payer: Self-pay | Admitting: Family Medicine

## 2023-03-14 ENCOUNTER — Ambulatory Visit: Payer: Commercial Managed Care - PPO | Admitting: Family Medicine

## 2023-03-14 ENCOUNTER — Other Ambulatory Visit: Payer: Self-pay | Admitting: Family Medicine

## 2023-03-14 VITALS — BP 112/70 | HR 54 | Temp 97.3°F | Ht 67.0 in | Wt 140.6 lb

## 2023-03-14 DIAGNOSIS — Z125 Encounter for screening for malignant neoplasm of prostate: Secondary | ICD-10-CM | POA: Diagnosis not present

## 2023-03-14 DIAGNOSIS — Z131 Encounter for screening for diabetes mellitus: Secondary | ICD-10-CM | POA: Diagnosis not present

## 2023-03-14 DIAGNOSIS — E785 Hyperlipidemia, unspecified: Secondary | ICD-10-CM | POA: Diagnosis not present

## 2023-03-14 DIAGNOSIS — Z0000 Encounter for general adult medical examination without abnormal findings: Secondary | ICD-10-CM | POA: Diagnosis not present

## 2023-03-14 DIAGNOSIS — Z789 Other specified health status: Secondary | ICD-10-CM | POA: Diagnosis not present

## 2023-03-14 DIAGNOSIS — Z833 Family history of diabetes mellitus: Secondary | ICD-10-CM

## 2023-03-14 LAB — COMPREHENSIVE METABOLIC PANEL
ALT: 24 U/L (ref 0–53)
AST: 27 U/L (ref 0–37)
Albumin: 4.6 g/dL (ref 3.5–5.2)
Alkaline Phosphatase: 42 U/L (ref 39–117)
BUN: 10 mg/dL (ref 6–23)
CO2: 30 meq/L (ref 19–32)
Calcium: 9.5 mg/dL (ref 8.4–10.5)
Chloride: 101 meq/L (ref 96–112)
Creatinine, Ser: 0.59 mg/dL (ref 0.40–1.50)
GFR: 121.48 mL/min (ref >60.00)
Glucose, Bld: 84 mg/dL (ref 70–99)
Potassium: 4.4 meq/L (ref 3.5–5.1)
Sodium: 136 meq/L (ref 135–145)
Total Bilirubin: 0.8 mg/dL (ref 0.2–1.2)
Total Protein: 7.2 g/dL (ref 6.0–8.3)

## 2023-03-14 LAB — CBC WITH DIFFERENTIAL/PLATELET
Basophils Absolute: 0.1 10*3/uL (ref 0.0–0.1)
Basophils Relative: 1.1 % (ref 0.0–3.0)
Eosinophils Absolute: 0.1 10*3/uL (ref 0.0–0.7)
Eosinophils Relative: 1 % (ref 0.0–5.0)
HCT: 42.5 % (ref 39.0–52.0)
Hemoglobin: 14 g/dL (ref 13.0–17.0)
Lymphocytes Relative: 37.8 % (ref 12.0–46.0)
Lymphs Abs: 1.9 10*3/uL (ref 0.7–4.0)
MCHC: 32.9 g/dL (ref 30.0–36.0)
MCV: 87.9 fl (ref 78.0–100.0)
Monocytes Absolute: 0.5 10*3/uL (ref 0.1–1.0)
Monocytes Relative: 9 % (ref 3.0–12.0)
Neutro Abs: 2.6 10*3/uL (ref 1.4–7.7)
Neutrophils Relative %: 51.1 % (ref 43.0–77.0)
Platelets: 224 10*3/uL (ref 150.0–400.0)
RBC: 4.84 Mil/uL (ref 4.22–5.81)
RDW: 12.8 % (ref 11.5–15.5)
WBC: 5.2 10*3/uL (ref 4.0–10.5)

## 2023-03-14 LAB — LIPID PANEL
Cholesterol: 147 mg/dL (ref 0–200)
HDL: 55.8 mg/dL (ref >39.00)
LDL Cholesterol: 77 mg/dL (ref 0–99)
NonHDL: 90.96
Total CHOL/HDL Ratio: 3
Triglycerides: 72 mg/dL (ref 0.0–149.0)
VLDL: 14.4 mg/dL (ref 0.0–40.0)

## 2023-03-14 LAB — VITAMIN D 25 HYDROXY (VIT D DEFICIENCY, FRACTURES): VITD: 18.04 ng/mL — ABNORMAL LOW (ref 30.00–100.00)

## 2023-03-14 LAB — HEMOGLOBIN A1C: Hgb A1c MFr Bld: 5.9 % (ref 4.6–6.5)

## 2023-03-14 LAB — VITAMIN B12: Vitamin B-12: 476 pg/mL (ref 211–911)

## 2023-03-14 MED ORDER — VITAMIN D (ERGOCALCIFEROL) 1.25 MG (50000 UNIT) PO CAPS: 50000.0000 [IU] | ORAL_CAPSULE | ORAL | 1 refills | Status: DC

## 2023-03-14 NOTE — Progress Notes (Addendum)
 Phone: [phone]    Subjective:  Patient presents today for their annual physical. Chief complaint-noted.   See problem oriented charting- ROS- full  review of systems was completed and negative  except for: Right elbow pain  The following were reviewed and entered/updated in epic: Past Medical History:  Diagnosis Date   Hair loss    finasteride 3-4x a week. still losing some hair   Seasonal allergies    allegra   Patient Active Problem List   Diagnosis Date Noted   Hyperlipidemia, unspecified 08/12/2021    Priority: Medium    Eczema 08/12/2021    Priority: Low   Hair loss 05/22/2017    Priority: Low   Seasonal allergies     Priority: Low   Past Surgical History:  Procedure Laterality Date   PILONIDAL CYST EXCISION      Family History  Problem Relation Age of Onset   Hypertension Mother    Heart attack Father        age 68, smoker   Diabetes Father        just metformin   Diabetes Sister        obese- metformin. right at 6.5   Stroke Maternal Grandmother    Atrial fibrillation Maternal Grandmother    Dementia Maternal Grandfather    Dementia Paternal Grandmother    Dementia Paternal Grandfather    Colon cancer Cousin        fathers cousin at 94. first cousin    Medications- reviewed and updated Current Outpatient Medications  Medication Sig Dispense Refill   simvastatin (ZOCOR) 10 MG tablet Take 1 tablet (10 mg total) by mouth at bedtime. (Patient taking differently: Take 10 mg by mouth every other day.) 90 tablet 3   tacrolimus (PROTOPIC) 0.1 % ointment Apply externally 2 (two) times daily as directed for 6 weeks (Patient not taking: Reported on 03/14/2023) 60 g 0   triamcinolone ointment (KENALOG) 0.1 % apply 1 application topically 2 (two) times daily. 7-10 days for dry areas on hands (Patient not taking: Reported on 03/14/2023) 80 g 1   No current facility-administered medications for this visit.    Allergies-reviewed and updated No Known  Allergies  Social History   Social History Narrative   Married. 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 Oropharynx normal Neck: no thyromegaly CV: RRR no murmurs rubs or gallops Lungs: CTAB no crackles, wheeze, rhonchi Abdomen: soft/nontender/nondistended/normal bowel sounds. No rebound or guarding.  Ext: no edema Skin: warm, dry Neuro: grossly normal, moves all extremities, PERRLA    Assessment and Plan:  43 y.o. male presenting for annual physical.  Health Maintenance counseling: 1. Anticipatory guidance: Patient counseled regarding regular dental exams -q6 months, eye exams - LASIK when younger and no recent issues- some dry eye uses refresh,  avoiding smoking and second hand smoke , limiting alcohol to 2 beverages per day- rare social, no illicit drugs.   2. Risk factor reduction:  Advised patient of need for regular exercise and diet  rich and fruits and vegetables to reduce risk of heart attack and stroke.  Exercise- running regularly and [employer] once a week. 1-2 miles daily running and usually 4 on weekends.  Diet/weight management-maintains very healthy weight- vegetarian diet- consider multivitamin.  Wt Readings from Last 3 Encounters:  03/14/23 140 lb 9.6 oz (63.8 kg)  08/12/21 147 lb 6.4 oz (66.9 kg)  09/20/19 153 lb 12.8 oz (69.8 kg)  3. Immunizations/screenings/ancillary studies-fully up-to-date  Immunization History  Administered Date(s) Administered   Influenza,inj,Quad PF,6+ Mos 10/17/2022   Influenza-Unspecified 10/09/2019, 09/17/2020   Moderna Covid-19 Fall Seasonal Vaccine 24yrs & older  10/17/2022   PFIZER(Purple Top)SARS-COV-2 Vaccination 12/20/2018, 01/18/2019   Pfizer(Comirnaty)Fall Seasonal Vaccine 12 years and older 12/02/2021   Tdap 06/03/2008, 05/08/2019  4. Prostate cancer screening- checked in the past on Dr. Maple Hudson day labs-discussed resuming this at age 53 in office- in 2023 was 0.3, in 2024 was 0.5  Lab Results  Component Value Date   PSA 0.29 03/30/2017   5. Colon cancer screening - no family history, start at age 35  6. Skin cancer screening/prevention-refer to dermatology previously for dry hand issues- reports triamcinolone helpful. advised regular sunscreen use. Denies worrisome, changing, or new skin lesions. Not needing tacrolimus- but will leave on list 7. Testicular cancer screening- advised monthly self exams  8. STD screening- patient opts out-he is monogamous 9. Smoking associated screening-never smoker  Status of chronic or acute concerns   # Right upper extremity lower arm pain S:reports mild issue. Joined [employer] and can use childcare there - if picks up 43 year old will notice it -feels deeper than bicep tendon A/P: appears to possibly be brachioradialis strain- he is going to try relative rest and can reach out if wants to see physical therapy (declines for now)    #hyperlipidemia with family history of MI in father around age 8 (former smoker) S: Medication:Simvastatin 10 mg every other day started with LDL at 110 -Reassuring CT calcium scoring of 0 on 10/24/2019 - On vegetarian diet -Lipoprotein a not elevated  Lab Results  Component Value Date   CHOL 180 08/12/2021   HDL 48.10 08/12/2021   LDLCALC 110 (H) 08/12/2021   TRIG 108.0 08/12/2021   CHOLHDL 4 08/12/2021   A/P: lipids hopefully improved- update panel today on every other day simvastatin- no side effects with that   #a1c in 2023 was 5.8- update today  Recommended follow up: Return in about 1 year (around 03/13/2024) for physical or sooner if needed.Schedule b4 you  leave.  Lab/Order associations: fasting   ICD-10-CM   1. Preventative health care  Z00.00     2. Hyperlipidemia, unspecified hyperlipidemia type  E78.5 Comprehensive metabolic panel    CBC with Differential/Platelet    Lipid panel    3. Family history of diabetes mellitus  Z83.3 HgB A1c    4. Screening for prostate cancer  Z12.5     5. Screening for diabetes mellitus  Z13.1 HgB A1c    6. Vegetarian diet  Z78.9 VITAMIN D 25 Hydroxy (Vit-D Deficiency, Fractures)    Vitamin B12      No orders of the defined types were placed in this encounter.   Return precautions advised.   Tana Conch, MD

## 2023-03-14 NOTE — Patient Instructions (Addendum)
 Please stop by lab before you go If you have mychart- we will send your results within 3 business days of [place] receiving them.  If you do not have mychart- we will call you about results within 5 business days of [place] receiving them.  *please also note that you will see labs on mychart as soon as they post. I will later go in and write notes on them- will say "notes from Dr. Durene Cal"   Schedule dentist follow up   Recommended follow up: Return in about 1 year (around 03/13/2024) for physical or sooner if needed.Schedule b4 you leave.

## 2023-03-30 ENCOUNTER — Other Ambulatory Visit: Payer: Self-pay

## 2023-03-30 DIAGNOSIS — Z131 Encounter for screening for diabetes mellitus: Secondary | ICD-10-CM

## 2023-09-14 ENCOUNTER — Ambulatory Visit: Payer: Self-pay | Admitting: Family Medicine

## 2023-09-14 ENCOUNTER — Other Ambulatory Visit (INDEPENDENT_AMBULATORY_CARE_PROVIDER_SITE_OTHER)

## 2023-09-14 DIAGNOSIS — Z131 Encounter for screening for diabetes mellitus: Secondary | ICD-10-CM | POA: Diagnosis not present

## 2023-09-14 LAB — HEMOGLOBIN A1C: Hgb A1c MFr Bld: 5.9 % (ref 4.6–6.5)

## 2023-09-14 LAB — VITAMIN D 25 HYDROXY (VIT D DEFICIENCY, FRACTURES): VITD: 22.01 ng/mL — ABNORMAL LOW (ref 30.00–100.00)

## 2023-09-15 MED ORDER — VITAMIN D (ERGOCALCIFEROL) 1.25 MG (50000 UNIT) PO CAPS
50000.0000 [IU] | ORAL_CAPSULE | ORAL | 1 refills | Status: AC
  Filled 2023-10-09: qty 12, 84d supply, fill #0
  Filled 2024-01-11: qty 12, 84d supply, fill #1

## 2023-10-09 ENCOUNTER — Other Ambulatory Visit (HOSPITAL_COMMUNITY): Payer: Self-pay

## 2023-10-11 ENCOUNTER — Other Ambulatory Visit (HOSPITAL_COMMUNITY): Payer: Self-pay

## 2024-01-11 ENCOUNTER — Other Ambulatory Visit (HOSPITAL_COMMUNITY): Payer: Self-pay

## 2024-01-15 ENCOUNTER — Other Ambulatory Visit (HOSPITAL_COMMUNITY): Payer: Self-pay

## 2024-01-16 ENCOUNTER — Other Ambulatory Visit (HOSPITAL_COMMUNITY): Payer: Self-pay
